# Patient Record
Sex: Female | Born: 1995 | Race: White | Hispanic: Yes | Marital: Single | Smoking: Never smoker
Health system: Southern US, Community
[De-identification: ages and names within clinical notes are randomized; demographics above are authoritative.]

## PROBLEM LIST (undated history)

## (undated) DIAGNOSIS — B9689 Other specified bacterial agents as the cause of diseases classified elsewhere: Secondary | ICD-10-CM

## (undated) DIAGNOSIS — R1031 Right lower quadrant pain: Principal | ICD-10-CM

## (undated) DIAGNOSIS — N939 Abnormal uterine and vaginal bleeding, unspecified: Secondary | ICD-10-CM

## (undated) DIAGNOSIS — B009 Herpesviral infection, unspecified: Principal | ICD-10-CM

## (undated) DIAGNOSIS — N760 Acute vaginitis: Secondary | ICD-10-CM

## (undated) DIAGNOSIS — N898 Other specified noninflammatory disorders of vagina: Principal | ICD-10-CM

## (undated) HISTORY — DX: Other specified bacterial agents as the cause of diseases classified elsewhere: B96.89

## (undated) HISTORY — DX: Right lower quadrant pain: R10.31

## (undated) HISTORY — DX: Abnormal uterine and vaginal bleeding, unspecified: N93.9

## (undated) HISTORY — DX: Other specified noninflammatory disorders of vagina: N89.8

## (undated) HISTORY — DX: Herpesviral infection, unspecified: B00.9

## (undated) HISTORY — PX: ROTATOR CUFF REPAIR: SHX139

## (undated) HISTORY — DX: Acute vaginitis: N76.0

---
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
## 2013-01-04 ENCOUNTER — Ambulatory Visit (INDEPENDENT_AMBULATORY_CARE_PROVIDER_SITE_OTHER): Payer: Medicaid Other | Admitting: Orthopedic Surgery

## 2013-01-04 ENCOUNTER — Encounter: Payer: Self-pay | Admitting: Orthopedic Surgery

## 2013-01-04 VITALS — BP 102/70 | Ht 62.0 in | Wt 119.0 lb

## 2013-01-04 DIAGNOSIS — Z5189 Encounter for other specified aftercare: Secondary | ICD-10-CM

## 2013-01-04 DIAGNOSIS — M629 Disorder of muscle, unspecified: Secondary | ICD-10-CM

## 2013-01-04 DIAGNOSIS — S83412D Sprain of medial collateral ligament of left knee, subsequent encounter: Secondary | ICD-10-CM

## 2013-01-04 DIAGNOSIS — S93699A Other sprain of unspecified foot, initial encounter: Secondary | ICD-10-CM

## 2013-01-04 NOTE — Progress Notes (Signed)
Patient ID: Tammy Lyons, female   DOB: [birth year], 17 y.o.   MRN: [id number] Chief Complaint  Patient presents with  . Follow-up    6 week recheck on left knee and left foot. DOI 11-15-12.   This is a followup visit history as noted below the patient is still complaining of medial knee pain and walk around. She also has a complaint of pain in the plantar fascia which started during the season has not been resolved at this point. As far as the left foot goes she has pain on the bottom of the foot in the middle of the arch over the medial cord of the plantar fascia. Quality of the pain is dull aching, severity moderate. Duration several months. Timing worse with exercise.  Medial knee pain persists as well. 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 She was hit in the ankle causing a valgus stress to the left knee the. At that time she felt a pop and since that time has had swelling and pain medially. Crutches taking ibuprofen and tramadol after ER visit which showed no fracture X-rays negative  Impression grade 2 MCL sprain recommend hinged brace for 6 weeks followup. Start knee exercises range of motion and strengthening  Weight-bear as tolerated  Continue ibuprofen and tramadol Knee Pain        ER follow up left knee pain d/t injury 11/15/12    Examination General appearance is normal, the patient is alert and oriented x3 with normal mood and affect. Vital signs stable as recorded She is ambulatory no assistive devices at this time She is still tender over the medial femoral condyle painful abduction stress or valgus stress to the knee full range of motion no swelling she appears to have a stable Lachman test with  normal muscle tone skin is intact pulses normal sensation remains normal  Left foot exam tenderness in the plantar fascia when compared to the right there is no tenderness. The cord of the plantar fascia still intact and palpable throughout there is painful dorsiflexion of the metatarsophalangeal joints.  Impression Encounter Diagnoses  Name Primary?  . Knee MCL sprain, left, subsequent encounter Yes  . Ruptured plantar fascia    We will need an MRI to rule out anterior cruciate ligament/medial meniscus tear with this MCL sprain  Recommend physical therapy for incomplete plantar fascia rupture/sprain  Follow up after MRI

## 2013-01-04 NOTE — Patient Instructions (Signed)
Therapy start at [facility]   MRI f/u

## 2013-01-11 ENCOUNTER — Encounter: Payer: Self-pay | Admitting: Advanced Practice Midwife

## 2013-01-11 ENCOUNTER — Ambulatory Visit (INDEPENDENT_AMBULATORY_CARE_PROVIDER_SITE_OTHER): Payer: Medicaid Other | Admitting: Advanced Practice Midwife

## 2013-01-11 VITALS — BP 96/70 | Ht 61.0 in | Wt 119.5 lb

## 2013-01-11 DIAGNOSIS — Z3046 Encounter for surveillance of implantable subdermal contraceptive: Secondary | ICD-10-CM

## 2013-01-11 DIAGNOSIS — Z3202 Encounter for pregnancy test, result negative: Secondary | ICD-10-CM

## 2013-01-11 DIAGNOSIS — Z975 Presence of (intrauterine) contraceptive device: Secondary | ICD-10-CM

## 2013-01-11 DIAGNOSIS — Z30017 Encounter for initial prescription of implantable subdermal contraceptive: Secondary | ICD-10-CM

## 2013-01-11 NOTE — Progress Notes (Signed)
Tammy Lyons was given informed consent for removal of her Implanon, time out was performed.  Signed copy in the chart.  Appropriate time out taken. Implanon site identified.  Area prepped in usual sterile fashon. 2 cc of 1% lidocaine was used to anesthetize the area starting with the distal end of the implant. A small stab incision was made right beside the implant on the distal portion.  The implanon rod was grasped using hemostats and removed without difficulty.  There was less than 3 cc blood loss. There were no complications. Next, the area was cleansed again and the new Nexplanon was inserted without difficulty.  Steri strips and a pressure bandage were applied.  Pt was instructed to remove pressure bandage in a few hours, and keep insertion site covered with a bandaid for 3 days.  Follow-up scheduled PRN problems  CRESENZO-DISHMAN,Nyja Westbrook 01/11/2013 4:29 PM

## 2013-01-12 ENCOUNTER — Ambulatory Visit (HOSPITAL_COMMUNITY): Payer: Medicaid Other | Attending: Physical Therapy | Admitting: Physical Therapy

## 2013-01-15 ENCOUNTER — Ambulatory Visit (HOSPITAL_COMMUNITY): Payer: Medicaid Other

## 2013-01-25 ENCOUNTER — Ambulatory Visit: Payer: Medicaid Other | Admitting: Orthopedic Surgery

## 2013-01-26 ENCOUNTER — Ambulatory Visit (HOSPITAL_COMMUNITY)
Admission: RE | Admit: 2013-01-26 | Discharge: 2013-01-26 | Disposition: A | Discharge status: Home / Self Care | Payer: Medicaid Other | Source: Ambulatory Visit | Attending: Orthopedic Surgery | Admitting: Orthopedic Surgery

## 2013-01-26 DIAGNOSIS — M25569 Pain in unspecified knee: Secondary | ICD-10-CM | POA: Insufficient documentation

## 2013-01-26 DIAGNOSIS — IMO0002 Reserved for concepts with insufficient information to code with codable children: Secondary | ICD-10-CM | POA: Insufficient documentation

## 2013-01-26 DIAGNOSIS — S83412D Sprain of medial collateral ligament of left knee, subsequent encounter: Secondary | ICD-10-CM

## 2013-01-30 ENCOUNTER — Ambulatory Visit: Payer: Medicaid Other | Admitting: Adult Health

## 2013-02-05 ENCOUNTER — Encounter: Payer: Self-pay | Admitting: Adult Health

## 2013-02-05 ENCOUNTER — Ambulatory Visit (INDEPENDENT_AMBULATORY_CARE_PROVIDER_SITE_OTHER): Payer: Medicaid Other | Admitting: Adult Health

## 2013-02-05 VITALS — BP 100/60 | Ht 61.0 in | Wt 124.0 lb

## 2013-02-05 DIAGNOSIS — B9689 Other specified bacterial agents as the cause of diseases classified elsewhere: Secondary | ICD-10-CM

## 2013-02-05 DIAGNOSIS — N760 Acute vaginitis: Secondary | ICD-10-CM

## 2013-02-05 DIAGNOSIS — N898 Other specified noninflammatory disorders of vagina: Secondary | ICD-10-CM

## 2013-02-05 DIAGNOSIS — A499 Bacterial infection, unspecified: Secondary | ICD-10-CM

## 2013-02-05 HISTORY — DX: Other specified bacterial agents as the cause of diseases classified elsewhere: B96.89

## 2013-02-05 HISTORY — DX: Other specified noninflammatory disorders of vagina: N89.8

## 2013-02-05 LAB — POCT WET PREP (WET MOUNT): Trichomonas Wet Prep HPF POC: NEGATIVE

## 2013-02-05 MED ORDER — METRONIDAZOLE 500 MG PO TABS: 500.0000 mg | ORAL_TABLET | Freq: Two times a day (BID) | ORAL | Status: DC

## 2013-02-05 NOTE — Progress Notes (Signed)
Patient informed of MRI results. 

## 2013-02-05 NOTE — Progress Notes (Signed)
Subjective:     Patient ID: Tammy Lyons, female   DOB: [birth year], 17 y.o.   MRN: [id number]  HPI Tammy Lyons is in complaining of a vaginal discharge. Some itching. She has had sex. Going to [employer] in [place] in fall.  Review of Systems Positives in HPI Reviewed past medical,surgical, social and family history. Reviewed medications and allergies.     Objective:   Physical Exam BP 100/60  Ht 5\' 1"  (1.549 m)  Wt 124 lb (56.246 kg)  BMI 23.44 kg/m2   Skin warm and dry.Pelvic: external genitalia is normal in appearance, vagina: white frothy discharge without odor, cervix:smooth, uterus: normal size, shape and contour, non tender, no masses felt, adnexa: no masses or tenderness noted. Wet prep: moderate + for clue cells and  rare+WBCs.  Assessment:     Vaginal discharge BV    Plan:     Rx flagyl 500 mg 1 bid x 7 days #14 with no refills Follow up prn Review handout on BV

## 2013-02-05 NOTE — Patient Instructions (Addendum)
Bacterial Vaginosis Bacterial vaginosis (BV) is a vaginal infection where the normal balance of bacteria in the vagina is disrupted. The normal balance is then replaced by an overgrowth of certain bacteria. There are several different kinds of bacteria that can cause BV. BV is the most common vaginal infection in women of childbearing age. CAUSES   The cause of BV is not fully understood. BV develops when there is an increase or imbalance of harmful bacteria.  Some activities or behaviors can upset the normal balance of bacteria in the vagina and put women at increased risk including:  Having a new sex partner or multiple sex partners.  Douching.  Using an intrauterine device (IUD) for contraception.  It is not clear what role sexual activity plays in the development of BV. However, women that have never had sexual intercourse are rarely infected with BV. Women do not get BV from toilet seats, bedding, swimming pools or from touching objects around them.  SYMPTOMS   Grey vaginal discharge.  A fish-like odor with discharge, especially after sexual intercourse.  Itching or burning of the vagina and vulva.  Burning or pain with urination.  Some women have no signs or symptoms at all. DIAGNOSIS  Your caregiver must examine the vagina for signs of BV. Your caregiver will perform lab tests and look at the sample of vaginal fluid through a microscope. They will look for bacteria and abnormal cells (clue cells), a pH test higher than 4.5, and a positive amine test all associated with BV.  RISKS AND COMPLICATIONS   Pelvic inflammatory disease (PID).  Infections following gynecology surgery.  Developing HIV.  Developing herpes virus. TREATMENT  Sometimes BV will clear up without treatment. However, all women with symptoms of BV should be treated to avoid complications, especially if gynecology surgery is planned. Female partners generally do not need to be treated. However, BV may spread  between female sex partners so treatment is helpful in preventing a recurrence of BV.   BV may be treated with antibiotics. The antibiotics come in either pill or vaginal cream forms. Either can be used with nonpregnant or pregnant women, but the recommended dosages differ. These antibiotics are not harmful to the baby.  BV can recur after treatment. If this happens, a second round of antibiotics will often be prescribed.  Treatment is important for pregnant women. If not treated, BV can cause a premature delivery, especially for a pregnant woman who had a premature birth in the past. All pregnant women who have symptoms of BV should be checked and treated.  For chronic reoccurrence of BV, treatment with a type of prescribed gel vaginally twice a week is helpful. HOME CARE INSTRUCTIONS   Finish all medication as directed by your caregiver.  Do not have sex until treatment is completed.  Tell your sexual partner that you have a vaginal infection. They should see their caregiver and be treated if they have problems, such as a mild rash or itching.  Practice safe sex. Use condoms. Only have 1 sex partner. PREVENTION  Basic prevention steps can help reduce the risk of upsetting the natural balance of bacteria in the vagina and developing BV:  Do not have sexual intercourse (be abstinent).  Do not douche.  Use all of the medicine prescribed for treatment of BV, even if the signs and symptoms go away.  Tell your sex partner if you have BV. That way, they can be treated, if needed, to prevent reoccurrence. SEEK MEDICAL CARE IF:     Your symptoms are not improving after 3 days of treatment.  You have increased discharge, pain, or fever. MAKE SURE YOU:   Understand these instructions.  Will watch your condition.  Will get help right away if you are not doing well or get worse. FOR MORE INFORMATION  Division of STD Prevention (DSTDP), Centers for Disease Control and Prevention:  [employer].za American Social Health Association (ASHA): www.ashastd.org  Document Released: 06/21/2005 Document Revised: 09/13/2011 Document Reviewed: 12/12/2008 [employer] Patient Information 2014 [employer], [place]. take flagyl no alcohol folllow up prn

## 2013-02-05 NOTE — Assessment & Plan Note (Signed)
Treated with flagyl

## 2013-03-14 ENCOUNTER — Ambulatory Visit (INDEPENDENT_AMBULATORY_CARE_PROVIDER_SITE_OTHER): Payer: Medicaid Other | Admitting: Adult Health

## 2013-03-14 ENCOUNTER — Encounter: Payer: Self-pay | Admitting: Adult Health

## 2013-03-14 VITALS — BP 104/80 | Ht 61.0 in | Wt 124.0 lb

## 2013-03-14 DIAGNOSIS — N898 Other specified noninflammatory disorders of vagina: Secondary | ICD-10-CM

## 2013-03-14 DIAGNOSIS — A499 Bacterial infection, unspecified: Secondary | ICD-10-CM

## 2013-03-14 DIAGNOSIS — N760 Acute vaginitis: Secondary | ICD-10-CM

## 2013-03-14 DIAGNOSIS — R1031 Right lower quadrant pain: Secondary | ICD-10-CM

## 2013-03-14 DIAGNOSIS — B9689 Other specified bacterial agents as the cause of diseases classified elsewhere: Secondary | ICD-10-CM

## 2013-03-14 LAB — POCT WET PREP (WET MOUNT): Trichomonas Wet Prep HPF POC: NEGATIVE

## 2013-03-14 MED ORDER — METRONIDAZOLE 500 MG PO TABS: 500.0000 mg | ORAL_TABLET | Freq: Two times a day (BID) | ORAL | Status: DC

## 2013-03-14 NOTE — Progress Notes (Signed)
Subjective:     Patient ID: Tammy Lyons, female   DOB: Dec 09, 1995, 17 y.o.   MRN: [id number]  HPI Tammy Lyons is in complaining of spotting after sex and pain in right side after sex.and she has white spots under tongue.  Review of Systems Positives in HPI Reviewed past medical,surgical, social and family history. Reviewed medications and allergies.     Objective:   Physical Exam BP 104/80  Ht 5\' 1"  (1.549 m)  Wt 124 lb (56.246 kg)  BMI 23.44 kg/m2has nexplanon and spots but no period regularly,Skin warm and dry.Pelvic: external genitalia is normal in appearance, vagina: white discharge with odor, cervix is everted and irritated, uterus: normal size, shape and contour, non tender, no masses felt, adnexa: no masses or tenderness noted. Wet prep: + for clue cells and +WBCs. GC/CHL obtained.     Assessment:      Vaginal discharge BV  RLQ pain after sex and spotting after sex    Plan:     Rx flagyl 500 mg 1 bid x 7 days, no alcohol, review handout on BV   No sex if does use condom Follow up in 4 weeks, if persists will get [place] Rinse mouth with warm salt water and if not better see dentist

## 2013-03-14 NOTE — Patient Instructions (Addendum)
Bacterial Vaginosis Bacterial vaginosis (BV) is a vaginal infection where the normal balance of bacteria in the vagina is disrupted. The normal balance is then replaced by an overgrowth of certain bacteria. There are several different kinds of bacteria that can cause BV. BV is the most common vaginal infection in women of childbearing age. CAUSES   The cause of BV is not fully understood. BV develops when there is an increase or imbalance of harmful bacteria.  Some activities or behaviors can upset the normal balance of bacteria in the vagina and put women at increased risk including:  Having a new sex partner or multiple sex partners.  Douching.  Using an intrauterine device (IUD) for contraception.  It is not clear what role sexual activity plays in the development of BV. However, women that have never had sexual intercourse are rarely infected with BV. Women do not get BV from toilet seats, bedding, swimming pools or from touching objects around them.  SYMPTOMS   Grey vaginal discharge.  A fish-like odor with discharge, especially after sexual intercourse.  Itching or burning of the vagina and vulva.  Burning or pain with urination.  Some women have no signs or symptoms at all. DIAGNOSIS  Your caregiver must examine the vagina for signs of BV. Your caregiver will perform lab tests and look at the sample of vaginal fluid through a microscope. They will look for bacteria and abnormal cells (clue cells), a pH test higher than 4.5, and a positive amine test all associated with BV.  RISKS AND COMPLICATIONS   Pelvic inflammatory disease (PID).  Infections following gynecology surgery.  Developing HIV.  Developing herpes virus. TREATMENT  Sometimes BV will clear up without treatment. However, all women with symptoms of BV should be treated to avoid complications, especially if gynecology surgery is planned. Female partners generally do not need to be treated. However, BV may spread  between female sex partners so treatment is helpful in preventing a recurrence of BV.   BV may be treated with antibiotics. The antibiotics come in either pill or vaginal cream forms. Either can be used with nonpregnant or pregnant women, but the recommended dosages differ. These antibiotics are not harmful to the baby.  BV can recur after treatment. If this happens, a second round of antibiotics will often be prescribed.  Treatment is important for pregnant women. If not treated, BV can cause a premature delivery, especially for a pregnant woman who had a premature birth in the past. All pregnant women who have symptoms of BV should be checked and treated.  For chronic reoccurrence of BV, treatment with a type of prescribed gel vaginally twice a week is helpful. HOME CARE INSTRUCTIONS   Finish all medication as directed by your caregiver.  Do not have sex until treatment is completed.  Tell your sexual partner that you have a vaginal infection. They should see their caregiver and be treated if they have problems, such as a mild rash or itching.  Practice safe sex. Use condoms. Only have 1 sex partner. PREVENTION  Basic prevention steps can help reduce the risk of upsetting the natural balance of bacteria in the vagina and developing BV:  Do not have sexual intercourse (be abstinent).  Do not douche.  Use all of the medicine prescribed for treatment of BV, even if the signs and symptoms go away.  Tell your sex partner if you have BV. That way, they can be treated, if needed, to prevent reoccurrence. SEEK MEDICAL CARE IF:     Your symptoms are not improving after 3 days of treatment.  You have increased discharge, pain, or fever. MAKE SURE YOU:   Understand these instructions.  Will watch your condition.  Will get help right away if you are not doing well or get worse. FOR MORE INFORMATION  Division of STD Prevention (DSTDP), Centers for Disease Control and Prevention:  SolutionApps[employer].za American Social Health Association (ASHA): www.ashastd.org  Document Released: 06/21/2005 Document Revised: 09/13/2011 Document Reviewed: 12/12/2008 [facility] Patient Information 2014 [place], [place]. Take flagyl No sex no alcohol

## 2013-03-15 ENCOUNTER — Telehealth: Payer: Self-pay | Admitting: Adult Health

## 2013-03-15 NOTE — Telephone Encounter (Signed)
Pt aware test negative 

## 2013-04-11 ENCOUNTER — Encounter: Payer: Self-pay | Admitting: Adult Health

## 2013-04-11 ENCOUNTER — Ambulatory Visit (INDEPENDENT_AMBULATORY_CARE_PROVIDER_SITE_OTHER): Payer: Medicaid Other | Admitting: Adult Health

## 2013-04-11 VITALS — BP 102/78 | Ht 61.0 in | Wt 125.5 lb

## 2013-04-11 DIAGNOSIS — R1031 Right lower quadrant pain: Secondary | ICD-10-CM

## 2013-04-11 HISTORY — DX: Right lower quadrant pain: R10.31

## 2013-04-11 NOTE — Patient Instructions (Signed)
Pelvic Pain Pelvic pain is pain below the belly button and located between your hips. Acute pain may last a few hours or days. Chronic pelvic pain may last weeks and months. The cause may be different for different types of pain. The pain may be dull or sharp, mild or severe and can interfere with your daily activities. Write down and tell your caregiver:   Exactly where the pain is located.  If it comes and goes or is there all the time.  When it happens (with sex, urination, bowel movement, etc.)  If the pain is related to your menstrual period or stress. Your caregiver will take a full history and do a complete physical exam and Pap test. CAUSES   Painful menstrual periods (dysmenorrhea).  Normal ovulation (Mittelschmertz) that occurs in the middle of the menstrual cycle every month.  The pelvic organs get engorged with blood just before the menstrual period (pelvic congestive syndrome).  Scar tissue from an infection or past surgery (pelvic adhesions).  Cancer of the female pelvic organs. When there is pain with cancer, it has been there for a long time.  The lining of the uterus (endometrium) abnormally grows in places like the pelvis and on the pelvic organs (endometriosis).  A form of endometriosis with the lining of the uterus present inside of the muscle tissue of the uterus (adenomyosis).  Fibroid tumor (noncancerous) in the uterus.  Bladder problems such as infection, bladder spasms of the muscle tissue of the bladder.  Intestinal problems (irritable bowel syndrome, colitis, an ulcer or gastrointestinal infection).  Polyps of the cervix or uterus.  Pregnancy in the tube (ectopic pregnancy).  The opening of the cervix is too small for the menstrual blood to flow through it (cervical stenosis).  Physical or sexual abuse (past or present).  Musculo-skeletal problems from poor posture, problems with the vertebrae of the lower back or the uterine pelvic muscles falling  (prolapse).  Psychological problems such as depression or stress.  IUD (intrauterine device) in the uterus. DIAGNOSIS  Tests to make a diagnosis depends on the type, location, severity and what causes the pain to occur. Tests that may be needed include:  Blood tests.  Urine tests  Ultrasound.  X-rays.  CT Scan.  MRI.  Laparoscopy.  Major surgery. TREATMENT  Treatment will depend on the cause of the pain, which includes:  Prescription or over-the-counter pain medication.  Antibiotics.  Birth control pills.  Hormone treatment.  Nerve blocking injections.  Physical therapy.  Antidepressants.  Counseling with a psychiatrist or psychologist.  Minor or major surgery. HOME CARE INSTRUCTIONS   Only take over-the-counter or prescription medicines for pain, discomfort or fever as directed by your caregiver.  Follow your caregiver's advice to treat your pain.  Rest.  Avoid sexual intercourse if it causes the pain.  Apply warm or cold compresses (which ever works best) to the pain area.  Do relaxation exercises such as yoga or meditation.  Try acupuncture.  Avoid stressful situations.  Try group therapy.  If the pain is because of a stomach/intestinal upset, drink clear liquids, eat a bland light food diet until the symptoms go away. SEEK MEDICAL CARE IF:   You need stronger prescription pain medication.  You develop pain with sexual intercourse.  You have pain with urination.  You develop a temperature of 102 F (38.9 C) with the pain.  You are still in pain after 4 hours of taking prescription medication for the pain.  You need depression medication.    Your IUD is causing pain and you want it removed. SEEK IMMEDIATE MEDICAL CARE IF:  You develop very severe pain or tenderness.  You faint, have chills, severe weakness or dehydration.  You develop heavy vaginal bleeding or passing solid tissue.  You develop a temperature of 102 F (38.9 C)  with the pain.  You have blood in the urine.  You are being physically or sexually abused.  You have uncontrolled vomiting and diarrhea.  You are depressed and afraid of harming yourself or someone else. Document Released: 07/29/2004 Document Revised: 09/13/2011 Document Reviewed: 04/25/2008 [facility] Patient Information 2013 [place], [place]. Follow up in 1 week for [place]

## 2013-04-11 NOTE — Progress Notes (Signed)
Subjective:     Patient ID: Tammy Lyons, female   DOB: 1995/07/17, 17 y.o.   MRN: [id number]  HPI Tammy Lyons is back in follow up.The discharge is better, sex better, but still has some pain RLQ that comes and goes.No vomiting or diarrhea or fever some occasional nausea with smells.  Review of Systems See HPI Reviewed past medical,surgical, social and family history. Reviewed medications and allergies.     Objective:   Physical Exam BP 102/78  Ht 5\' 1"  (1.549 m)  Wt 125 lb 8 oz (56.926 kg)  BMI 23.73 kg/m2  Skin warm and dry.Pelvic: external genitalia is normal in appearance, vagina: scant discharge without odor, cervix:smooth, uterus: normal size, shape and contour, non tender, no masses felt, adnexa: no masses, some tenderness noted RLQ, will get [place] to assess, discussed I think could be ovarian cyst    Assessment:     RLQ pain     Plan:     Return in 1 week for [place] and see me Review handout on pelvic pain, use motrin if needed

## 2013-04-18 ENCOUNTER — Ambulatory Visit (INDEPENDENT_AMBULATORY_CARE_PROVIDER_SITE_OTHER): Payer: Medicaid Other | Admitting: Adult Health

## 2013-04-18 ENCOUNTER — Encounter: Payer: Self-pay | Admitting: Adult Health

## 2013-04-18 ENCOUNTER — Ambulatory Visit (INDEPENDENT_AMBULATORY_CARE_PROVIDER_SITE_OTHER): Payer: Medicaid Other

## 2013-04-18 VITALS — BP 104/78 | Ht 61.0 in | Wt 125.0 lb

## 2013-04-18 DIAGNOSIS — R1031 Right lower quadrant pain: Secondary | ICD-10-CM

## 2013-04-18 DIAGNOSIS — R9389 Abnormal findings on diagnostic imaging of other specified body structures: Secondary | ICD-10-CM

## 2013-04-18 MED ORDER — ESTRADIOL 2 MG PO TABS: 2.0000 mg | ORAL_TABLET | Freq: Every day | ORAL | Status: DC

## 2013-04-18 NOTE — Progress Notes (Signed)
Subjective:     Patient ID: Tammy Lyons, female   DOB: 06-25-1996, 17 y.o.   MRN: [id number]  HPI Tammy Lyons is back for [place] for RLQ pain. Has nexplanon and no periods.  Review of Systems See HPI Reviewed past medical,surgical, social and family history. Reviewed medications and allergies.     Objective:   Physical Exam BP 104/78  Ht 5\' 1"  (1.549 m)  Wt 125 lb (56.7 kg)  BMI 23.63 kg/m2   Reviewed [place] with pt and her Mom, the ovaries are normal and the uterus is normal but has a 12.4 mm endometrial strip with complex debris, discussed with Dr Emelda Fear, and will add estrace 2 mg x 14 days   Assessment:    RLQ pain Thickened endometrium with complex debris    Plan:     Rx estrace 2 mg 1 daily x 14 days Follow up in 3 weeks

## 2013-04-18 NOTE — Patient Instructions (Signed)
Take estrace daily x 14 days Follow up in 3 weeks

## 2013-05-04 ENCOUNTER — Emergency Department (HOSPITAL_COMMUNITY)
Admission: EM | Admit: 2013-05-04 | Discharge: 2013-05-04 | Disposition: A | Discharge status: Home / Self Care | Payer: Medicaid Other | Attending: Emergency Medicine | Admitting: Emergency Medicine

## 2013-05-04 ENCOUNTER — Encounter (HOSPITAL_COMMUNITY): Payer: Self-pay | Admitting: Emergency Medicine

## 2013-05-04 DIAGNOSIS — J069 Acute upper respiratory infection, unspecified: Secondary | ICD-10-CM | POA: Insufficient documentation

## 2013-05-04 DIAGNOSIS — Z8619 Personal history of other infectious and parasitic diseases: Secondary | ICD-10-CM | POA: Insufficient documentation

## 2013-05-04 DIAGNOSIS — Z79899 Other long term (current) drug therapy: Secondary | ICD-10-CM | POA: Insufficient documentation

## 2013-05-04 DIAGNOSIS — Z8742 Personal history of other diseases of the female genital tract: Secondary | ICD-10-CM | POA: Insufficient documentation

## 2013-05-04 DIAGNOSIS — R51 Headache: Secondary | ICD-10-CM | POA: Insufficient documentation

## 2013-05-04 DIAGNOSIS — Z791 Long term (current) use of non-steroidal anti-inflammatories (NSAID): Secondary | ICD-10-CM | POA: Insufficient documentation

## 2013-05-04 DIAGNOSIS — R5381 Other malaise: Secondary | ICD-10-CM | POA: Insufficient documentation

## 2013-05-04 MED ORDER — METOCLOPRAMIDE HCL 5 MG/ML IJ SOLN
10.0000 mg | Freq: Once | INTRAMUSCULAR | Status: AC
  Administered 2013-05-04: 10 mg via INTRAVENOUS
  Filled 2013-05-04: qty 2

## 2013-05-04 MED ORDER — SODIUM CHLORIDE 0.9 % IV SOLN
1000.0000 mL | Freq: Once | INTRAVENOUS | Status: AC
  Administered 2013-05-04: 1000 mL via INTRAVENOUS

## 2013-05-04 MED ORDER — DEXAMETHASONE SODIUM PHOSPHATE 10 MG/ML IJ SOLN
10.0000 mg | Freq: Once | INTRAMUSCULAR | Status: AC
  Administered 2013-05-04: 10 mg via INTRAVENOUS
  Filled 2013-05-04: qty 1

## 2013-05-04 MED ORDER — DIPHENHYDRAMINE HCL 50 MG/ML IJ SOLN
25.0000 mg | Freq: Once | INTRAMUSCULAR | Status: AC
  Administered 2013-05-04: 25 mg via INTRAVENOUS
  Filled 2013-05-04: qty 1

## 2013-05-04 MED ORDER — SODIUM CHLORIDE 0.9 % IV SOLN: 1000.0000 mL | INTRAVENOUS | Status: DC

## 2013-05-04 NOTE — ED Provider Notes (Signed)
This chart was scribed for Hilario Quarry, MD by Bennett Scrape, ED Scribe. This patient was seen in room APA03/APA03 and the patient's care was started at 8:38 AM.  Pt was signed out to me by Dr. Preston Fleeting. Pt rechecked and feels improved. Will provide a work note for [employer]. Pt is agreeable with discharge.  I personally performed the services described in this documentation, which was scribed in my presence. The recorded information has been reviewed and considered.    Hilario Quarry, MD 05/13/13 1004

## 2013-05-04 NOTE — ED Notes (Signed)
Pt reporting headache since yesterday.  Also reporting general weakness and URI symptoms for several days.

## 2013-05-04 NOTE — ED Provider Notes (Signed)
CSN: [id number]     Arrival date & time 05/04/13  1191 History   First MD Initiated Contact with Patient 05/04/13 [phone]     Chief Complaint  Patient presents with  . Migraine  . Cough  . Weakness   (Consider location/radiation/quality/duration/timing/severity/associated sxs/prior Treatment) Patient is a 17 y.o. female presenting with migraines, cough, and weakness. The history is provided by the patient.  Migraine  Cough Weakness  She noted onset yesterday of generalized headache which is described as a throbbing and pounding sensation. Headache is severe and she rates it at 10/10. She has history of similar headaches that are usually not this bad. She states it feels worse when she is walking around and better she is lying still. She denies photophobia or phonophobia. She denies visual disturbance. There's been nausea and vomiting. She tried taking ibuprofen but it did not help her at all. She is also had symptoms of a URI for about the last 5 days. This includes nasal congestion with white rhinorrhea, mild sore throat, nonproductive cough. She denies arthralgias or myalgias persisted she is feeling generally weak. She's not had any fever or chills but has had some sweats. She denies any sick contacts. She is a nonsmoker.  Past Medical History  Diagnosis Date  . Vaginal discharge 02/05/2013  . BV (bacterial vaginosis) 02/05/2013  . RLQ abdominal pain 04/11/2013   History reviewed. No pertinent past surgical history. Family History  Problem Relation Age of Onset  . Diabetes Maternal Grandmother   . Hypertension Maternal Grandmother    History  Substance Use Topics  . Smoking status: Never Smoker   . Smokeless tobacco: Never Used  . Alcohol Use: No   OB History   Grav Para Term Preterm Abortions TAB SAB Ect Mult Living                 Review of Systems  Respiratory: Positive for cough.   Neurological: Positive for weakness.  All other systems reviewed and are  negative.    Allergies  Review of patient's allergies indicates no known allergies.  Home Medications   Current Outpatient Rx  Name  Route  Sig  Dispense  Refill  . estradiol (ESTRACE) 2 MG tablet   Oral   Take 1 tablet (2 mg total) by mouth daily.   14 tablet   0   . etonogestrel (IMPLANON) 68 MG IMPL implant   Subcutaneous   Inject 1 each into the skin once.           Marland Kitchen ibuprofen (ADVIL,MOTRIN) 800 MG tablet   Oral   Take 1 tablet (800 mg total) by mouth 3 (three) times daily.   90 tablet   1   . metroNIDAZOLE (FLAGYL) 500 MG tablet   Oral   Take 1 tablet (500 mg total) by mouth 2 (two) times daily.   14 tablet   0    BP 120/72  Pulse 94  Temp(Src) 99.4 F (37.4 C) (Oral)  Resp 20  Ht 5\' 1"  (1.549 m)  Wt 125 lb (56.7 kg)  BMI 23.63 kg/m2  SpO2 97% Physical Exam  Nursing note and vitals reviewed.  17 year old female, resting comfortably and in no acute distress. Vital signs are normal. Oxygen saturation is 97%, which is normal. Head is normocephalic and atraumatic. PERRLA, EOMI. Oropharynx is clear. Fundi show no hemorrhage, exudate, or papilledema. There is tenderness palpation over the temporalis muscles and over the insertion of the paracervical muscles bilaterally. Neck is  nontender and supple without adenopathy or JVD. Back is nontender and there is no CVA tenderness. Lungs are clear without rales, wheezes, or rhonchi. Chest is nontender. Heart has regular rate and rhythm without murmur. Abdomen is soft, flat, nontender without masses or hepatosplenomegaly and peristalsis is normoactive. Extremities have no cyanosis or edema, full range of motion is present. Skin is warm and dry without rash. Neurologic: Mental status is normal, cranial nerves are intact, there are no motor or sensory deficits.  ED Course  Procedures (including critical care time)  MDM   1. Headache   2. Upper respiratory infection    Headache which seems most consistent with  a muscle contraction headache. Upper respiratory infection which appears to be viral. She will be given IV fluids, IV metoclopramide, and IV diphenhydramine as well as IV dexamethasone. Case will be signed out to Dr. Rosalia Hammers to evaluate response to this treatment.    Dione Booze, MD 05/04/13 [phone]

## 2013-05-09 ENCOUNTER — Ambulatory Visit: Payer: Medicaid Other | Admitting: Adult Health

## 2013-05-14 ENCOUNTER — Ambulatory Visit: Payer: Medicaid Other | Admitting: Adult Health

## 2013-05-23 ENCOUNTER — Encounter: Payer: Self-pay | Admitting: Adult Health

## 2013-05-23 ENCOUNTER — Encounter (INDEPENDENT_AMBULATORY_CARE_PROVIDER_SITE_OTHER): Payer: Self-pay

## 2013-05-23 ENCOUNTER — Ambulatory Visit (INDEPENDENT_AMBULATORY_CARE_PROVIDER_SITE_OTHER): Payer: Medicaid Other | Admitting: Adult Health

## 2013-05-23 VITALS — BP 94/64 | Ht 62.0 in | Wt 131.0 lb

## 2013-05-23 DIAGNOSIS — Z975 Presence of (intrauterine) contraceptive device: Secondary | ICD-10-CM

## 2013-05-23 DIAGNOSIS — N949 Unspecified condition associated with female genital organs and menstrual cycle: Secondary | ICD-10-CM

## 2013-05-23 NOTE — Progress Notes (Signed)
Subjective:     Patient ID: Tammy Lyons, female   DOB: 08/19/1995, 17 y.o.   MRN: [id number]  HPI Tammy Lyons is a 17 year old female that was having RLQ and US showed debris in endometrial cavity, she has the nexplanon.She took estrace and had about 2.5 days of bleeding and no pain since.  Review of Systems See HPI Reviewed past medical,surgical, social and family history. Reviewed medications and allergies.     Objective:   Physical Exam    BP 94/64  Ht 5\' 2"  (1.575 m)  Wt 131 lb (59.421 kg)  BMI 23.95 kg/m2  LMP 04/19/2013 discussed that she is without pain and has no complaints today.  Assessment:     Nexplanon in place  History of RLQ pain and debris in endometrium    Plan:     Call if pain returns or any problems

## 2013-05-23 NOTE — Patient Instructions (Signed)
Call prn problems

## 2014-01-24 ENCOUNTER — Encounter: Payer: Medicaid Other | Admitting: Pediatrics

## 2014-03-06 ENCOUNTER — Encounter: Payer: Self-pay | Admitting: Pediatrics

## 2014-03-06 ENCOUNTER — Encounter: Payer: Medicaid Other | Admitting: Pediatrics

## 2014-04-26 ENCOUNTER — Ambulatory Visit: Payer: Medicaid Other

## 2014-06-17 ENCOUNTER — Encounter (HOSPITAL_COMMUNITY): Payer: Self-pay | Admitting: Emergency Medicine

## 2014-06-17 ENCOUNTER — Emergency Department (HOSPITAL_COMMUNITY)
Admission: EM | Admit: 2014-06-17 | Discharge: 2014-06-17 | Disposition: A | Discharge status: Home / Self Care | Payer: Medicaid Other | Attending: Emergency Medicine | Admitting: Emergency Medicine

## 2014-06-17 DIAGNOSIS — R51 Headache: Secondary | ICD-10-CM | POA: Insufficient documentation

## 2014-06-17 DIAGNOSIS — Z8619 Personal history of other infectious and parasitic diseases: Secondary | ICD-10-CM | POA: Diagnosis not present

## 2014-06-17 DIAGNOSIS — Z791 Long term (current) use of non-steroidal anti-inflammatories (NSAID): Secondary | ICD-10-CM | POA: Diagnosis not present

## 2014-06-17 DIAGNOSIS — Z8742 Personal history of other diseases of the female genital tract: Secondary | ICD-10-CM | POA: Insufficient documentation

## 2014-06-17 DIAGNOSIS — R519 Headache, unspecified: Secondary | ICD-10-CM

## 2014-06-17 MED ORDER — METOCLOPRAMIDE HCL 5 MG/ML IJ SOLN
10.0000 mg | Freq: Once | INTRAMUSCULAR | Status: AC
  Administered 2014-06-17: 10 mg via INTRAVENOUS
  Filled 2014-06-17: qty 2

## 2014-06-17 MED ORDER — DIPHENHYDRAMINE HCL 50 MG/ML IJ SOLN
25.0000 mg | Freq: Once | INTRAMUSCULAR | Status: AC
  Administered 2014-06-17: 25 mg via INTRAVENOUS
  Filled 2014-06-17: qty 1

## 2014-06-17 MED ORDER — KETOROLAC TROMETHAMINE 30 MG/ML IJ SOLN
30.0000 mg | Freq: Once | INTRAMUSCULAR | Status: AC
  Administered 2014-06-17: 30 mg via INTRAVENOUS
  Filled 2014-06-17: qty 1

## 2014-06-17 NOTE — ED Notes (Signed)
Patient with no complaints at this time. Respirations even and unlabored. Skin warm/dry. Discharge instructions reviewed with patient at this time. Patient given opportunity to voice concerns/ask questions. Patient discharged at this time and left Emergency Department with steady gait.   

## 2014-06-17 NOTE — ED Notes (Signed)
Pt reports onset of sore throat and cough yesterday. Woke up with migraine and emesis at 0100.

## 2014-06-17 NOTE — ED Provider Notes (Signed)
CSN: [id number]     Arrival date & time 06/17/14  [id number] History   First MD Initiated Contact with Patient 06/17/14 0703     Chief Complaint  Patient presents with  . Migraine     (Consider location/radiation/quality/duration/timing/severity/associated sxs/prior Treatment) Patient is a 18 y.o. female presenting with migraines. The history is provided by the patient (pt complains of a headache).  Migraine This is a chronic problem. The current episode started 1 to 2 hours ago. The problem occurs daily. The problem has not changed since onset.Associated symptoms include headaches. Pertinent negatives include no chest pain and no abdominal pain. Nothing aggravates the symptoms. Nothing relieves the symptoms.    Past Medical History  Diagnosis Date  . Vaginal discharge 02/05/2013  . BV (bacterial vaginosis) 02/05/2013  . RLQ abdominal pain 04/11/2013   History reviewed. No pertinent past surgical history. Family History  Problem Relation Age of Onset  . Diabetes Maternal Grandmother   . Hypertension Maternal Grandmother    History  Substance Use Topics  . Smoking status: Never Smoker   . Smokeless tobacco: Never Used  . Alcohol Use: No   OB History    No data available     Review of Systems  Constitutional: Negative for appetite change and fatigue.  HENT: Negative for congestion, ear discharge and sinus pressure.   Eyes: Negative for discharge.  Respiratory: Negative for cough.   Cardiovascular: Negative for chest pain.  Gastrointestinal: Negative for abdominal pain and diarrhea.  Genitourinary: Negative for frequency and hematuria.  Musculoskeletal: Negative for back pain.  Skin: Negative for rash.  Neurological: Positive for headaches. Negative for seizures.  Psychiatric/Behavioral: Negative for hallucinations.      Allergies  Review of patient's allergies indicates no known allergies.  Home Medications   Prior to Admission medications   Medication Sig Start Date  End Date Taking? Authorizing Provider  acetaminophen (TYLENOL) 500 MG tablet Take 500 mg by mouth every 6 (six) hours as needed.   Yes Historical Provider, MD  etonogestrel (IMPLANON) 68 MG IMPL implant Inject 1 each into the skin once.     Yes Historical Provider, MD  estradiol (ESTRACE) 2 MG tablet Take 1 tablet (2 mg total) by mouth daily. 04/18/13   Adline PotterJennifer A Griffin, NP  ibuprofen (ADVIL,MOTRIN) 800 MG tablet Take 1 tablet (800 mg total) by mouth 3 (three) times daily. 11/23/12   Vickki HearingStanley E Harrison, MD   BP 115/70 mmHg  Pulse 97  Temp(Src) 98.6 F (37 C) (Oral)  Resp 18  Ht 5\' 1"  (1.549 m)  Wt 135 lb (61.236 kg)  BMI 25.52 kg/m2  SpO2 100% Physical Exam  ED Course  Procedures (including critical care time) Labs Review Labs Reviewed - No data to display  Imaging Review No results found.   EKG Interpretation None      MDM   Final diagnoses:  Headache around the eyes    Pt improved with dx    Benny LennertJoseph L Nell Schrack, MD 06/17/14 [phone]

## 2014-06-17 NOTE — Discharge Instructions (Signed)
Follow up as needed

## 2014-06-21 IMAGING — CR DG KNEE COMPLETE 4+V*L*
4 series · 4 of 4 positions shown · non-contrast
Comparison: 08/29/2008.

CLINICAL DATA: Fall, knee pain.

LEFT KNEE - COMPLETE 4+ VIEW

[view not recorded (1 of 4)]
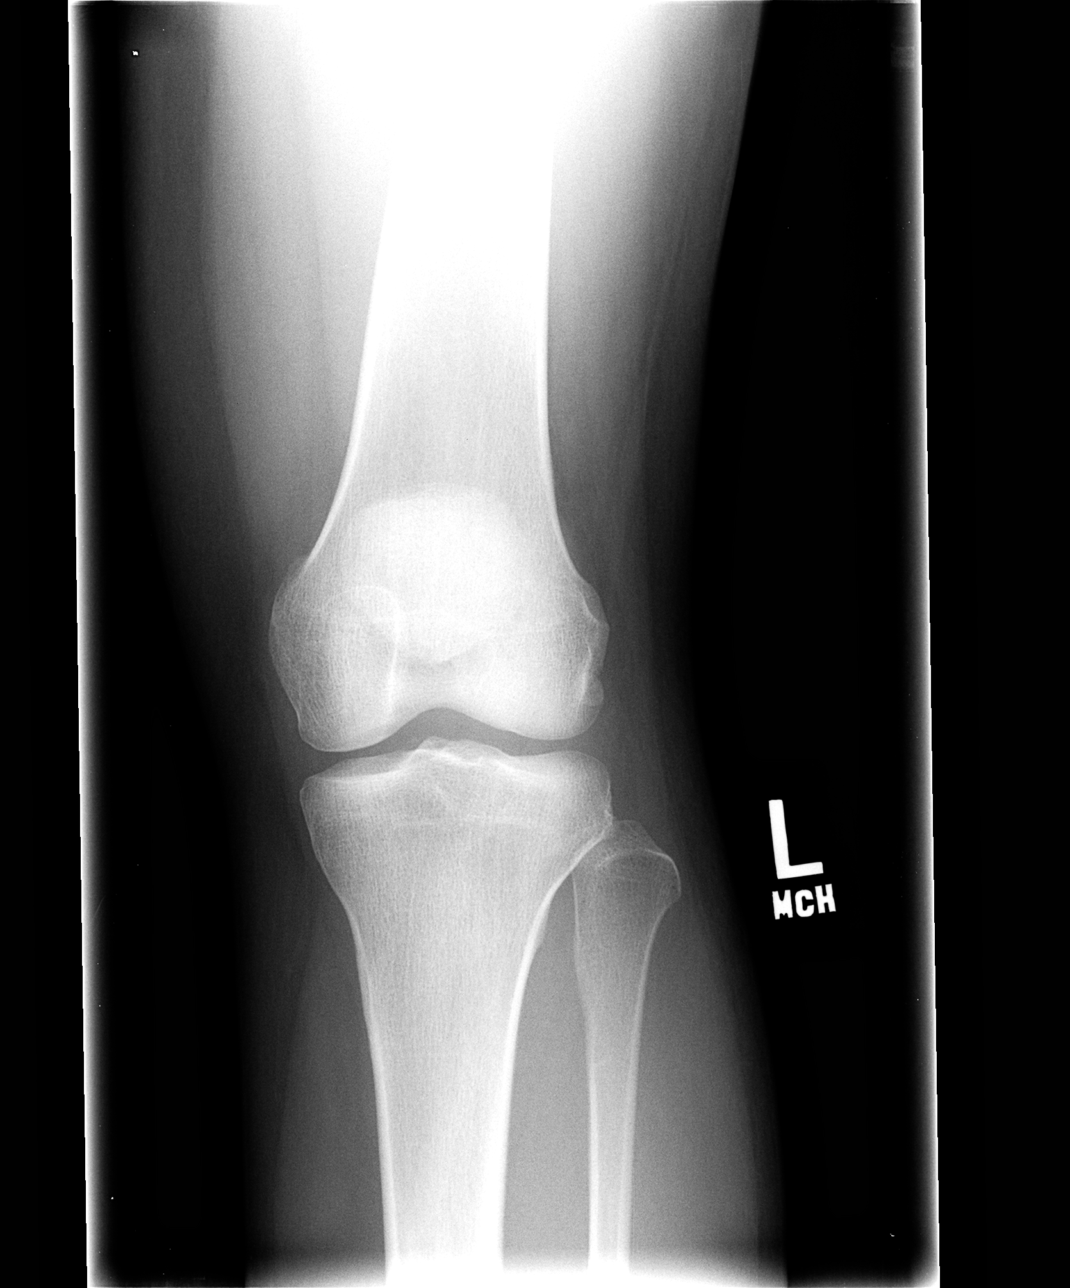

[view not recorded (2 of 4)]
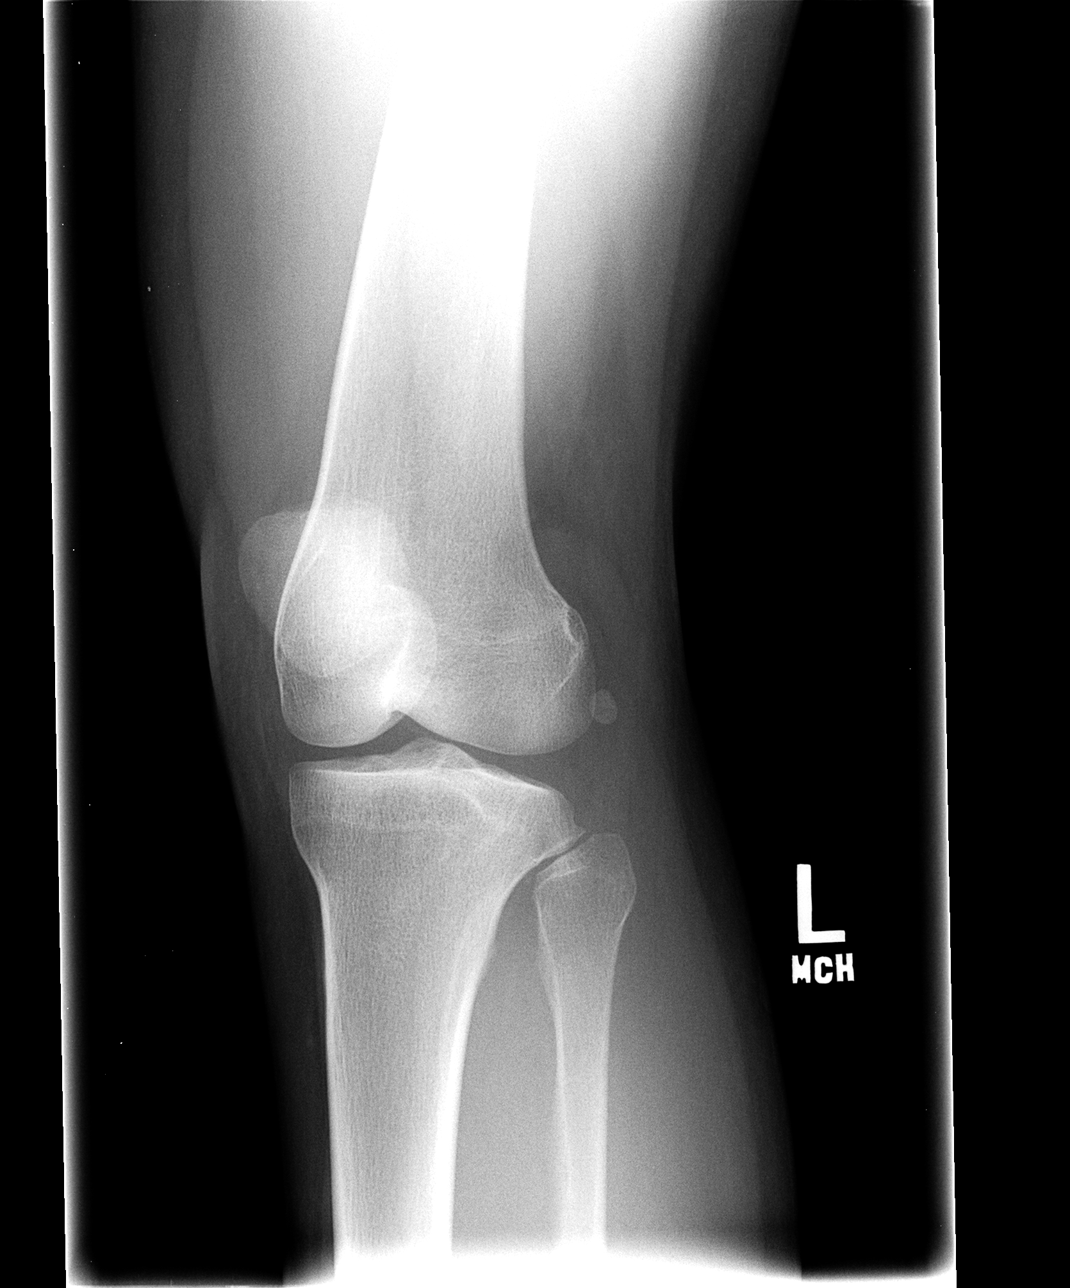

[view not recorded (3 of 4)]
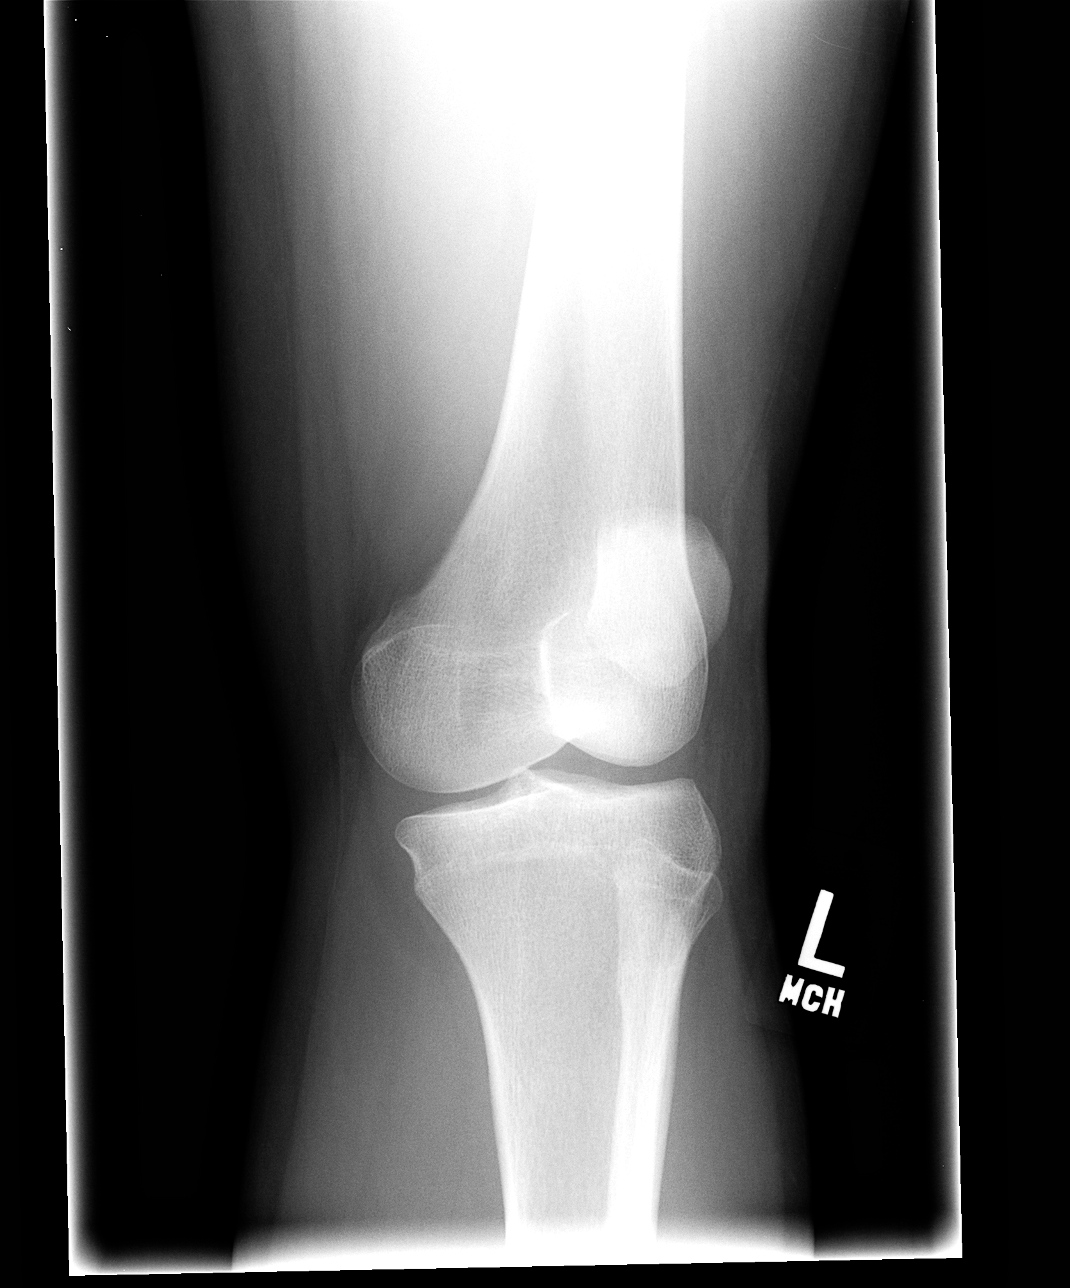

[view not recorded (4 of 4)]
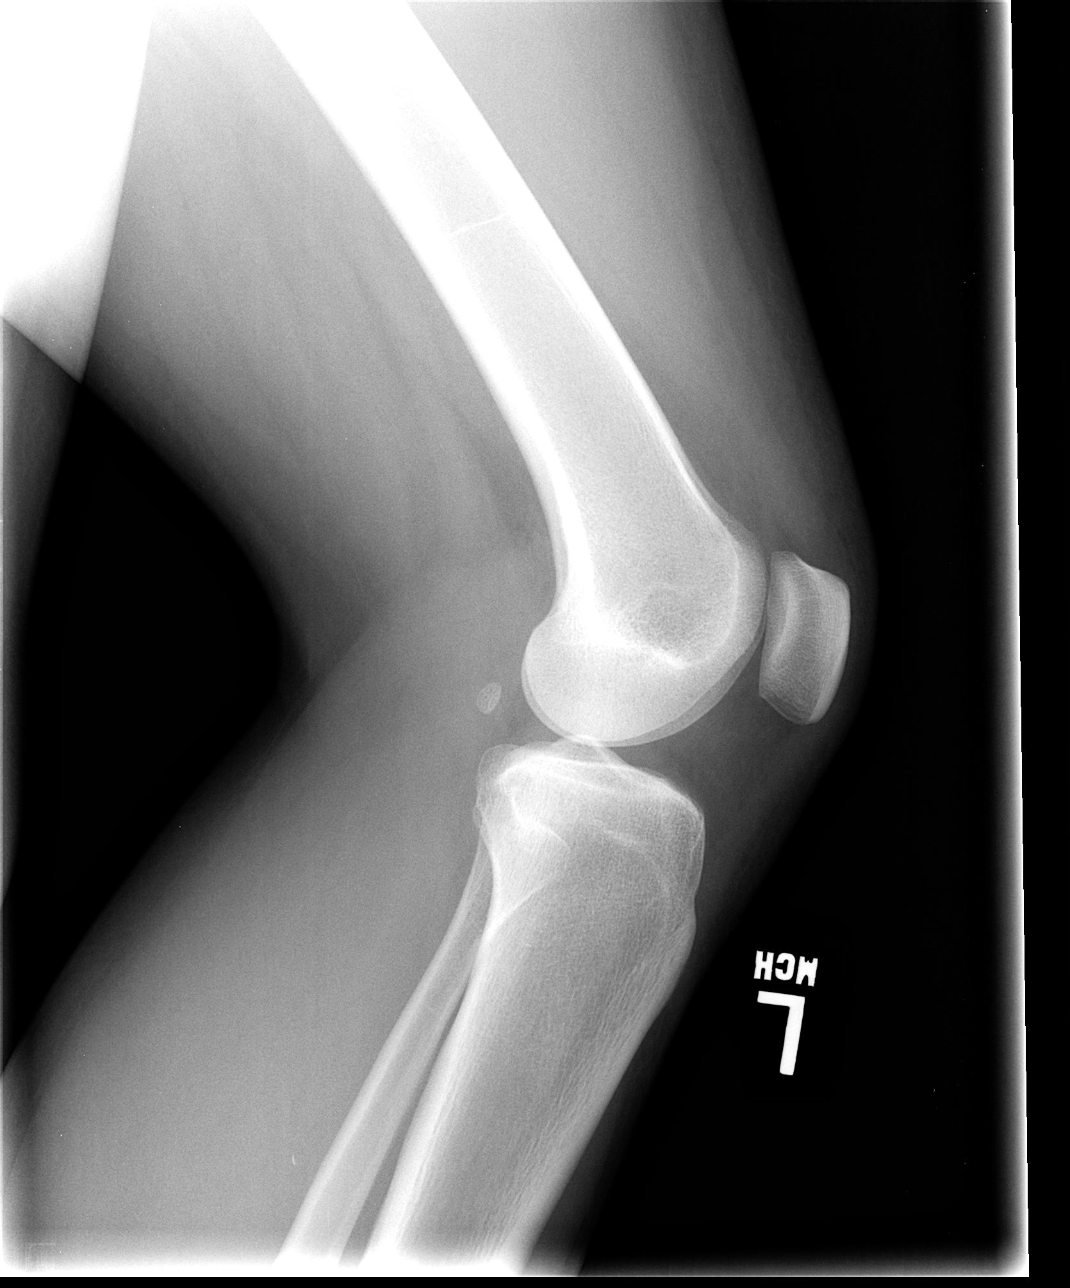

[4 of 4 positions shown; findings below may reference images not displayed]

FINDINGS: No acute bony abnormality.  Specifically, no fracture,
subluxation, or dislocation.  Soft tissues are intact. Joint spaces
are maintained.  Normal bone mineralization.  No joint effusion.
IMPRESSION: Negative.

## 2014-09-23 ENCOUNTER — Ambulatory Visit (INDEPENDENT_AMBULATORY_CARE_PROVIDER_SITE_OTHER): Payer: Medicaid Other | Admitting: Adult Health

## 2014-09-23 ENCOUNTER — Encounter: Payer: Self-pay | Admitting: Adult Health

## 2014-09-23 VITALS — BP 118/70 | HR 72 | Ht 61.0 in | Wt 140.0 lb

## 2014-09-23 DIAGNOSIS — N898 Other specified noninflammatory disorders of vagina: Secondary | ICD-10-CM

## 2014-09-23 DIAGNOSIS — Z975 Presence of (intrauterine) contraceptive device: Secondary | ICD-10-CM | POA: Diagnosis not present

## 2014-09-23 DIAGNOSIS — N939 Abnormal uterine and vaginal bleeding, unspecified: Secondary | ICD-10-CM

## 2014-09-23 HISTORY — DX: Abnormal uterine and vaginal bleeding, unspecified: N93.9

## 2014-09-23 LAB — POCT WET PREP (WET MOUNT)

## 2014-09-23 MED ORDER — MEGESTROL ACETATE 40 MG PO TABS: ORAL_TABLET | ORAL | Status: DC

## 2014-09-23 NOTE — Progress Notes (Signed)
Subjective:     Patient ID: Tammy Lyons, female   DOB: 12/04/1995, 19 y.o.   MRN: [id number]  HPI Tammy Lyons is a 19 year old female in complaining of vaginal discharge with odor and bleeding with nexplanon.The blood is brown and has had some some cramping.  Review of Systems + AUB +vaginal discharge with odor, all other systems negative Reviewed past medical,surgical, social and family history. Reviewed medications and allergies.     Objective:   Physical Exam BP 118/70 mmHg  Pulse 72  Ht 5\' 1"  (1.549 m)  Wt 140 lb (63.504 kg)  BMI 26.47 kg/m2  LMP 09/20/2014 Skin warm and dry.Pelvic: external genitalia is normal in appearance no lesions, vagina: white discharge with slight odor,urethra has no lesions or masses noted, cervix:smooth with blood at os, uterus: normal size, shape and contour, non tender, no masses felt, adnexa: no masses or tenderness noted. Bladder is non tender and no masses felt. Wet prep: + few WBCs,  GC/CHL obtained.     Assessment:     Vaginal discharge AUB  Nexplanon in place    Plan:     Rx megace 40 mg #45 3 x 5 days then 2 x 5 days then 1 daily with 1 refill, if bleeding does not stop call, will get US   Pap and physical  in December

## 2014-09-23 NOTE — Patient Instructions (Signed)
Take megace til bleeding stops Pap in December Call if bleeding does no stop

## 2014-09-25 LAB — GC/CHLAMYDIA PROBE AMP
Chlamydia trachomatis, NAA: NEGATIVE
Neisseria gonorrhoeae by PCR: NEGATIVE

## 2017-01-27 ENCOUNTER — Ambulatory Visit (INDEPENDENT_AMBULATORY_CARE_PROVIDER_SITE_OTHER): Payer: 59 | Admitting: Obstetrics and Gynecology

## 2017-01-27 ENCOUNTER — Encounter: Payer: Self-pay | Admitting: Obstetrics and Gynecology

## 2017-01-27 VITALS — BP 122/70 | HR 103 | Wt 170.0 lb

## 2017-01-27 DIAGNOSIS — B379 Candidiasis, unspecified: Secondary | ICD-10-CM | POA: Diagnosis not present

## 2017-01-27 MED ORDER — FLUCONAZOLE 150 MG PO TABS: 150.0000 mg | ORAL_TABLET | Freq: Once | ORAL | 1 refills | Status: AC

## 2017-01-27 NOTE — Patient Instructions (Signed)

## 2017-01-27 NOTE — Progress Notes (Signed)
   [facility] Visit  01/27/2017            Patient name: Tammy Lyons MRN [id number]  Date of birth: 01/01/1996  CC & HPI:  Tammy Lyons is a 21 y.o. female presenting today for itching around vaginal entrance with onset of this Saturday. Pt has no alleviating factors. Pt denies smell, abnormal discharge, or abnormal color. She states her wisdom teeth were removed last Thursday, and was prescribed amoxacillin 5mg  4x a day, which she was taking until Saturday.  ROS:  ROS +itching -fever  Pertinent History Reviewed:   Reviewed: Significant for AUB, BV, vaginal discharge Medical         Past Medical History:  Diagnosis Date  . Abnormal uterine bleeding (AUB) 09/23/2014  . BV (bacterial vaginosis) 02/05/2013  . RLQ abdominal pain 04/11/2013  . Vaginal discharge 02/05/2013                              Surgical Hx:   History reviewed. No pertinent surgical history. Medications: Reviewed & Updated - see associated section                       Current Outpatient Prescriptions:  .  etonogestrel (IMPLANON) 68 MG IMPL implant, Inject 1 each into the skin once.  , Disp: , Rfl:    Social History: Reviewed -  reports that she has never smoked. She has never used smokeless tobacco.  Objective Findings:  Vitals: Blood pressure 122/70, pulse (!) 103, weight 170 lb (77.1 kg).  Physical Examination: General appearance - alert, well appearing, and in no distress Mental status - alert, oriented to person, place, and time Pelvic -  VULVA: normal appearing vulva with no masses, tenderness or lesions, diffuse vulvar erythema, satellite lesions to right of urethra , moderate white discharge VAGINA: normal appearing vagina with normal color and discharge, no lesions, moderate thick white discharge CERVIX: not visualized,  UTERUS: not visualized,  ADNEXA: normal adnexa in size, nontender and no masses  KOH + for yeast Negative clue cells  Assessment & Plan:   A:  1. Yeast infection  P:   1. Diflucan Rx    By signing my name below, I, Izna Ahmed, attest that this documentation has been prepared under the direction and in the presence of Tilda BurrowFerguson, Stephfon Bovey V, MD. Electronically Signed: Redge GainerIzna Ahmed, ED Scribe. 01/27/17. 3:35 PM.  I personally performed the services described in this documentation, which was SCRIBED in my presence. The recorded information has been reviewed and considered accurate. It has been edited as necessary during review. Tilda BurrowFERGUSON,Stefanee Mckell V, MD  Seen with Dr Earlene PlaterWallace.

## 2017-05-03 ENCOUNTER — Encounter (INDEPENDENT_AMBULATORY_CARE_PROVIDER_SITE_OTHER): Payer: Self-pay

## 2017-05-03 ENCOUNTER — Encounter: Payer: Self-pay | Admitting: Adult Health

## 2017-05-03 ENCOUNTER — Ambulatory Visit (INDEPENDENT_AMBULATORY_CARE_PROVIDER_SITE_OTHER): Payer: 59 | Admitting: Adult Health

## 2017-05-03 VITALS — BP 122/84 | HR 78 | Ht 61.0 in | Wt 172.0 lb

## 2017-05-03 DIAGNOSIS — N9089 Other specified noninflammatory disorders of vulva and perineum: Secondary | ICD-10-CM

## 2017-05-03 DIAGNOSIS — B009 Herpesviral infection, unspecified: Secondary | ICD-10-CM | POA: Diagnosis not present

## 2017-05-03 DIAGNOSIS — Z8619 Personal history of other infectious and parasitic diseases: Secondary | ICD-10-CM | POA: Diagnosis not present

## 2017-05-03 MED ORDER — NYSTATIN-TRIAMCINOLONE 100000-0.1 UNIT/GM-% EX CREA: 1.0000 "application " | TOPICAL_CREAM | Freq: Two times a day (BID) | CUTANEOUS | 0 refills | Status: DC

## 2017-05-03 MED ORDER — FLUCONAZOLE 150 MG PO TABS: ORAL_TABLET | ORAL | 1 refills | Status: DC

## 2017-05-03 NOTE — Progress Notes (Signed)
Subjective:     Patient ID: Tammy Lyons, female   DOB: 11/11/1995, 21 y.o.   MRN: [id number]  HPI Tammy Lyons is a 21 year old white female in complaining of irritation at clitoral area for about 5 days.She has had antibiotics recently for oral procedure and was treated for chlamydia in March and did not get POC.She was also treated for BV.  Review of Systems Vulva irritation  Reviewed past medical,surgical, social and family history. Reviewed medications and allergies.     Objective:   Physical Exam BP 122/84 (BP Location: Left Arm, Patient Position: Sitting, Cuff Size: Normal)   Pulse 78   Ht 5\' 1"  (1.549 m)   Wt 172 lb (78 kg)   BMI 32.50 kg/m    Skin warm and dry.Pelvic: external genitalia is normal in appearance, has 2 vertical lines almost like a cut in crease near clitoris and slightly red, vagina: pink  With good moisture and no discharge,urethra has no lesions or masses noted, cervix:smooth, uterus: normal size, shape and contour, non tender, no masses felt, adnexa: no masses or tenderness noted. Bladder is non tender and no masses felt. HSV culture obtained. GC/CHL obtained.  Assessment:     1. Vulvar irritation   2. History of chlamydia       Plan:     GC/CHL sent HSV culture sent  Meds ordered this encounter  Medications  . fluconazole (DIFLUCAN) 150 MG tablet    Sig: Take 1 now and repeat 1 in 3 days    Dispense:  2 tablet    Refill:  1    Order Specific Question:   Supervising Provider    Answer:   EURE, LUTHER H [[id number]]  . nystatin-triamcinolone (MYCOLOG II) cream    Sig: Apply 1 application topically 2 (two) times daily.    Dispense:  30 g    Refill:  0    Order Specific Question:   Supervising Provider    Answer:   Duane LopeEURE, LUTHER H [[id number]]   Follow up prn

## 2017-05-03 NOTE — Patient Instructions (Signed)
F/U prn

## 2017-05-06 ENCOUNTER — Encounter: Payer: Self-pay | Admitting: Adult Health

## 2017-05-06 ENCOUNTER — Telehealth: Payer: Self-pay | Admitting: Adult Health

## 2017-05-06 DIAGNOSIS — B009 Herpesviral infection, unspecified: Secondary | ICD-10-CM

## 2017-05-06 HISTORY — DX: Herpesviral infection, unspecified: B00.9

## 2017-05-06 LAB — GC/CHLAMYDIA PROBE AMP
Chlamydia trachomatis, NAA: NEGATIVE
Neisseria gonorrhoeae by PCR: NEGATIVE

## 2017-05-06 LAB — HERPES SIMPLEX VIRUS CULTURE

## 2017-05-06 MED ORDER — VALACYCLOVIR HCL 1 G PO TABS: 1000.0000 mg | ORAL_TABLET | Freq: Two times a day (BID) | ORAL | 1 refills | Status: AC

## 2017-05-06 NOTE — Telephone Encounter (Signed)
Pt aware GC/CHL negative, HSV +type 1,rx sent for valtrex

## 2024-07-03 ENCOUNTER — Encounter: Payer: Self-pay | Admitting: Emergency Medicine

## 2024-07-03 ENCOUNTER — Ambulatory Visit: Admission: EM | Admit: 2024-07-03 | Discharge: 2024-07-03 | Disposition: A | Discharge status: Home / Self Care

## 2024-07-03 DIAGNOSIS — J101 Influenza due to other identified influenza virus with other respiratory manifestations: Secondary | ICD-10-CM | POA: Diagnosis not present

## 2024-07-03 LAB — POC COVID19/FLU A&B COMBO
Covid Antigen, POC: NEGATIVE
Influenza A Antigen, POC: POSITIVE — AB
Influenza B Antigen, POC: NEGATIVE

## 2024-07-03 MED ORDER — PROMETHAZINE-DM 6.25-15 MG/5ML PO SYRP: 5.0000 mL | ORAL_SOLUTION | Freq: Four times a day (QID) | ORAL | 0 refills | Status: AC | PRN

## 2024-07-03 MED ORDER — AZELASTINE HCL 0.1 % NA SOLN: 2.0000 | Freq: Two times a day (BID) | NASAL | 0 refills | Status: AC

## 2024-07-03 NOTE — ED Provider Notes (Signed)
 " [facility]    CSN: [id number] Arrival date & time: 07/03/24  9188      History   Chief Complaint Chief Complaint  Patient presents with   Cough   Nasal Congestion    HPI Tammy Lyons is a 28 y.o. female.   The history is provided by the patient.   Patient presents with a 3-day history of chills, body aches, fatigue, cough, congestion, fever, and diarrhea.  Patient states that the diarrhea started 1 day ago.  States that she had several episodes yesterday, but has had 1 episode today.  She denies headache, ear pain, ear drainage, wheezing, difficulty breathing, abdominal pain, nausea, vomiting, or rash.  Patient states that she has been taking TheraFlu for her symptoms.  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   History of chlamydia 05/03/2017   Vulvar irritation 05/03/2017   Abnormal uterine bleeding (AUB) 09/23/2014   Increased endometrial stripe thickness 04/18/2013   RLQ abdominal pain 04/11/2013   Vaginal discharge 02/05/2013   BV (bacterial vaginosis) 02/05/2013   Nexplanon in place 01/11/2013   Ruptured plantar fascia 01/04/2013   Knee MCL sprain 11/23/2012    Past Surgical History:  Procedure Laterality Date   ROTATOR CUFF REPAIR      OB History     Gravida  0   Para  0   Term  0   Preterm  0   AB  0   Living  0      SAB  0   IAB  0   Ectopic  0   Multiple  0   Live Births  0            Home Medications    Prior to Admission medications  Medication Sig Start Date End Date Taking? Authorizing Provider  azelastine (ASTELIN) 0.1 % nasal spray Place 2 sprays into both nostrils 2 (two) times daily. Use in each nostril as directed  07/03/24  Yes Leath-Warren, Etta PARAS, NP  citalopram (CELEXA) 20 MG tablet Take 20 mg by mouth daily.   Yes [provider]  promethazine -dextromethorphan (PROMETHAZINE -DM) 6.25-15 MG/5ML syrup Take 5 mLs by mouth 4 (four) times daily as needed. 07/03/24  Yes Leath-Warren, Etta PARAS, NP  valACYclovir  (VALTREX ) 1000 MG tablet Take 1 tablet (1,000 mg total) by mouth 2 (two) times daily. 05/06/17   Signa Delon LABOR, NP    Family History Family History  Problem Relation Age of Onset   Diabetes Maternal Grandmother    Hypertension Maternal Grandmother    Cancer Father        liver    Social History Social History[1]   Allergies   Patient has no known allergies.   Review of Systems Review of Systems Per HPI  Physical Exam Triage Vital Signs ED Triage Vitals  Encounter Vitals Group     BP 07/03/24 0834 118/83     Girls Systolic BP Percentile --      Girls Diastolic BP Percentile --      Boys Systolic BP Percentile --      Boys Diastolic BP Percentile --      Pulse Rate 07/03/24 0834 77     Resp  07/03/24 0834 14     Temp 07/03/24 0834 98.4 F (36.9 C)     Temp Source 07/03/24 0834 Oral     SpO2 07/03/24 0834 98 %     Weight --      Height --      Head Circumference --      Peak Flow --      Pain Score 07/03/24 0832 5     Pain Loc --      Pain Education --      Exclude from Growth Chart --    No data found.  Updated Vital Signs BP 118/83 (BP Location: Right Arm)   Pulse 77   Temp 98.4 F (36.9 C) (Oral)   Resp 14   LMP 06/16/2024 (Exact Date)   SpO2 98%   Visual Acuity Right Eye Distance:   Left Eye Distance:   Bilateral Distance:    Right Eye Near:   Left Eye Near:    Bilateral Near:     Physical Exam Vitals and nursing note reviewed.  Constitutional:      General: She is not in acute distress.    Appearance: Normal appearance. She is well-developed.  HENT:     Head: Normocephalic and atraumatic.     Right Ear: Tympanic membrane, ear  canal and external ear normal.     Left Ear: Tympanic membrane, ear canal and external ear normal.     Nose: Congestion present.     Right Turbinates: Enlarged and swollen.     Left Turbinates: Enlarged and swollen.     Right Sinus: No maxillary sinus tenderness or frontal sinus tenderness.     Left Sinus: No maxillary sinus tenderness or frontal sinus tenderness.     Mouth/Throat:     Lips: Pink.     Mouth: Mucous membranes are moist.     Pharynx: Uvula midline. Posterior oropharyngeal erythema and postnasal drip present. No pharyngeal swelling, oropharyngeal exudate or uvula swelling.     Comments: Cobblestoning present to posterior oropharynx  Eyes:     Extraocular Movements: Extraocular movements intact.     Conjunctiva/sclera: Conjunctivae normal.     Pupils: Pupils are equal, round, and reactive to light.  Neck:     Thyroid: No thyromegaly.     Trachea: No tracheal deviation.  Cardiovascular:     Rate and Rhythm: Normal rate and regular rhythm.     Pulses: Normal pulses.     Heart sounds: Normal heart sounds.  Pulmonary:     Effort: Pulmonary effort is normal. No respiratory distress.     Breath sounds: Normal breath sounds. No stridor. No wheezing, rhonchi or rales.  Abdominal:     General: Bowel sounds are normal.     Palpations: Abdomen is soft.     Tenderness: There is no abdominal tenderness.  Musculoskeletal:     Cervical back: Normal range of motion and neck supple.  Skin:    General: Skin is warm and dry.  Neurological:     General: No focal deficit present.     Mental Status: She is alert and oriented to person, place, and time.  Psychiatric:        Mood and Affect: Mood normal.        Behavior: Behavior normal.        Thought Content: Thought content normal.        Judgment: Judgment normal.      UC Treatments / Results  Labs (all labs ordered are listed,  but only abnormal results are displayed) Labs Reviewed  POC COVID19/FLU A&B COMBO - Abnormal;  Notable for the following components:      Result Value   Influenza A Antigen, POC Positive (*)    All other components within normal limits    EKG   Radiology No results found.  Procedures Procedures (including critical care time)  Medications Ordered in UC Medications - No data to display  Initial Impression / Assessment and Plan / UC Course  I have reviewed the triage vital signs and the nursing notes.  Pertinent labs & imaging results that were available during my care of the patient were reviewed by me and considered in my medical decision making (see chart for details).  COVID/flu test was positive for influenza A.  Patient's symptoms started approximately 3 days ago, she is out of the window to begin Tamiflu at this time.  Will provide symptomatic treatment with Promethazine  DM for the cough and fluticasone 50 mcg nasal spray for nasal congestion and runny nose.  Supportive care recommendations were provided and discussed with the patient to include fluids, rest, over-the-counter analgesics, dietary modifications to help with diarrhea, and use of a humidifier for the cough.  Discussed indications with the patient regarding follow-up.  Patient was in agreement with this plan of care and verbalizes understanding.  All questions were answered.  Patient stable for discharge.  Work note was provided.  Final Clinical Impressions(s) / UC Diagnoses   Final diagnoses:  Influenza A     Discharge Instructions      The COVID/flu test was positive for influenza A. Take medication as prescribed. Increase fluids and allow for plenty of rest. You may take over-the-counter Tylenol  or ibuprofen  as needed for pain, fever, or general discomfort. Recommend the use of normal saline nasal spray throughout the day for nasal congestion or runny nose. For your cough, recommend use of a humidifier in your bedroom at nighttime during sleep and sleeping elevated on pillows while symptoms  persist. Dietary modifications to include increasing the fiber in your diet to help decrease diarrhea.  If decreased fails to improve with dietary modifications, recommend over-the-counter Imodium A-D. You should remain home until you have been fever free for 24 hours with no medication. Symptoms should begin to improve over the next 5 to 7 days.  If symptoms fail to improve, or begin to worsen, you may follow-up in this clinic or with your primary care physician for further evaluation. Follow-up as needed.     ED Prescriptions     Medication Sig Dispense Auth. Provider   promethazine -dextromethorphan (PROMETHAZINE -DM) 6.25-15 MG/5ML syrup Take 5 mLs by mouth 4 (four) times daily as needed. 118 mL Leath-Warren, Etta PARAS, NP   azelastine (ASTELIN) 0.1 % nasal spray Place 2 sprays into both nostrils 2 (two) times daily. Use in each nostril as directed 30 mL Leath-Warren, Etta PARAS, NP      PDMP not reviewed this encounter.     [1]  Social History Tobacco Use   Smoking status: Never   Smokeless tobacco: Never  Substance Use Topics   Alcohol use: No   Drug use: No     Gilmer Etta PARAS, NP 07/03/24 [phone]  "

## 2024-07-03 NOTE — Discharge Instructions (Signed)
 The COVID/flu test was positive for influenza A. Take medication as prescribed. Increase fluids and allow for plenty of rest. You may take over-the-counter Tylenol  or ibuprofen  as needed for pain, fever, or general discomfort. Recommend the use of normal saline nasal spray throughout the day for nasal congestion or runny nose. For your cough, recommend use of a humidifier in your bedroom at nighttime during sleep and sleeping elevated on pillows while symptoms persist. Dietary modifications to include increasing the fiber in your diet to help decrease diarrhea.  If decreased fails to improve with dietary modifications, recommend over-the-counter Imodium A-D. You should remain home until you have been fever free for 24 hours with no medication. Symptoms should begin to improve over the next 5 to 7 days.  If symptoms fail to improve, or begin to worsen, you may follow-up in this clinic or with your primary care physician for further evaluation. Follow-up as needed.

## 2024-07-03 NOTE — ED Triage Notes (Signed)
 Pt started having body aches, cough, congestion, fevers on Saturday. Reports taking day and night Theraflu. Pt reports yesterday started having diarrhea. Pt reports mucous is yellow-greenish.
